# Patient Record
Sex: Male | Born: 1940 | Race: Black or African American | Hispanic: No | Marital: Single | Smoking: Never smoker
Health system: Southern US, Community
[De-identification: ages and names within clinical notes are randomized; demographics above are authoritative.]

## PROBLEM LIST (undated history)

## (undated) DIAGNOSIS — N189 Chronic kidney disease, unspecified: Secondary | ICD-10-CM

## (undated) DIAGNOSIS — E119 Type 2 diabetes mellitus without complications: Secondary | ICD-10-CM

## (undated) DIAGNOSIS — I10 Essential (primary) hypertension: Secondary | ICD-10-CM

## (undated) DIAGNOSIS — I2510 Atherosclerotic heart disease of native coronary artery without angina pectoris: Secondary | ICD-10-CM

## (undated) DIAGNOSIS — E7800 Pure hypercholesterolemia, unspecified: Secondary | ICD-10-CM

## (undated) DIAGNOSIS — I509 Heart failure, unspecified: Secondary | ICD-10-CM

## (undated) HISTORY — DX: Chronic kidney disease, unspecified: N18.9

## (undated) HISTORY — PX: CORONARY ARTERY BYPASS GRAFT: SHX141

---

## 2009-09-20 ENCOUNTER — Emergency Department (HOSPITAL_COMMUNITY): Admission: EM | Admit: 2009-09-20 | Discharge: 2009-09-20 | Payer: Self-pay | Admitting: Emergency Medicine

## 2011-04-04 ENCOUNTER — Other Ambulatory Visit: Payer: Self-pay | Admitting: Otolaryngology

## 2011-04-04 DIAGNOSIS — D449 Neoplasm of uncertain behavior of unspecified endocrine gland: Secondary | ICD-10-CM

## 2011-04-06 ENCOUNTER — Ambulatory Visit
Admission: RE | Admit: 2011-04-06 | Discharge: 2011-04-06 | Disposition: A | Discharge status: Home / Self Care | Payer: Medicare Other | Source: Ambulatory Visit | Attending: Otolaryngology | Admitting: Otolaryngology

## 2011-04-06 DIAGNOSIS — D449 Neoplasm of uncertain behavior of unspecified endocrine gland: Secondary | ICD-10-CM

## 2011-04-10 ENCOUNTER — Other Ambulatory Visit: Payer: Self-pay | Admitting: Otolaryngology

## 2011-04-10 DIAGNOSIS — E041 Nontoxic single thyroid nodule: Secondary | ICD-10-CM

## 2011-04-12 ENCOUNTER — Other Ambulatory Visit (HOSPITAL_COMMUNITY)
Admission: RE | Admit: 2011-04-12 | Discharge: 2011-04-12 | Disposition: A | Discharge status: Home / Self Care | Payer: Medicare Other | Source: Ambulatory Visit | Attending: Interventional Radiology | Admitting: Interventional Radiology

## 2011-04-12 ENCOUNTER — Ambulatory Visit
Admission: RE | Admit: 2011-04-12 | Discharge: 2011-04-12 | Disposition: A | Discharge status: Home / Self Care | Payer: Medicare Other | Source: Ambulatory Visit | Attending: Otolaryngology | Admitting: Otolaryngology

## 2011-04-12 DIAGNOSIS — D34 Benign neoplasm of thyroid gland: Secondary | ICD-10-CM | POA: Insufficient documentation

## 2011-04-12 DIAGNOSIS — E041 Nontoxic single thyroid nodule: Secondary | ICD-10-CM

## 2011-10-12 ENCOUNTER — Other Ambulatory Visit: Payer: Self-pay | Admitting: Otolaryngology

## 2011-10-12 DIAGNOSIS — D449 Neoplasm of uncertain behavior of unspecified endocrine gland: Secondary | ICD-10-CM

## 2011-10-25 ENCOUNTER — Ambulatory Visit
Admission: RE | Admit: 2011-10-25 | Discharge: 2011-10-25 | Disposition: A | Discharge status: Home / Self Care | Payer: Medicare Other | Source: Ambulatory Visit | Attending: Otolaryngology | Admitting: Otolaryngology

## 2011-10-25 DIAGNOSIS — D449 Neoplasm of uncertain behavior of unspecified endocrine gland: Secondary | ICD-10-CM

## 2013-07-11 IMAGING — US US SOFT TISSUE HEAD/NECK
1 series · 14 of 25 positions shown · non-contrast
Comparison: None.

CLINICAL DATA: Nodule noted on outside study, not currently
available

THYROID ULTRASOUND
TECHNIQUE: Ultrasound examination of the thyroid gland and adjacent
soft tissues was performed.

[Series 1: us soft tissue head/neck · 0.07mm/px · 14 of 58 slices shown]
[im 1/58]
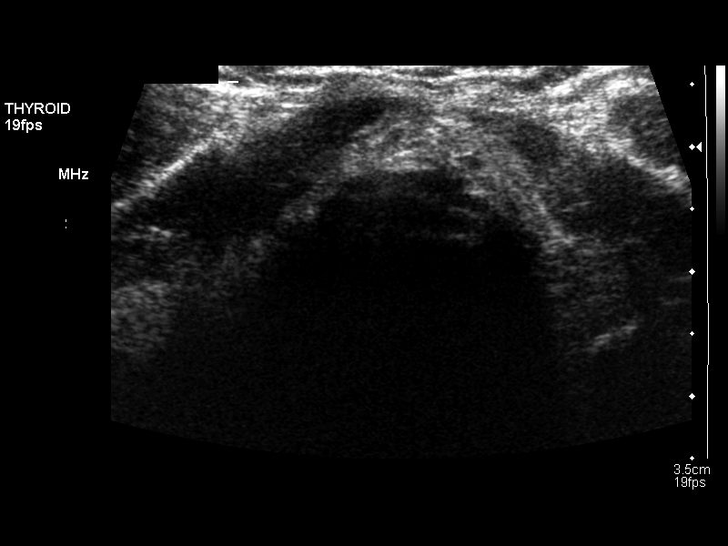
[im 5/58]
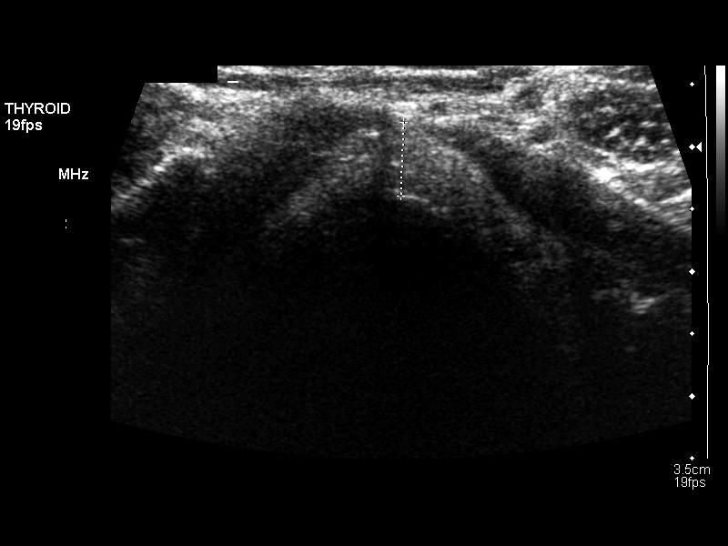
[im 10/58]
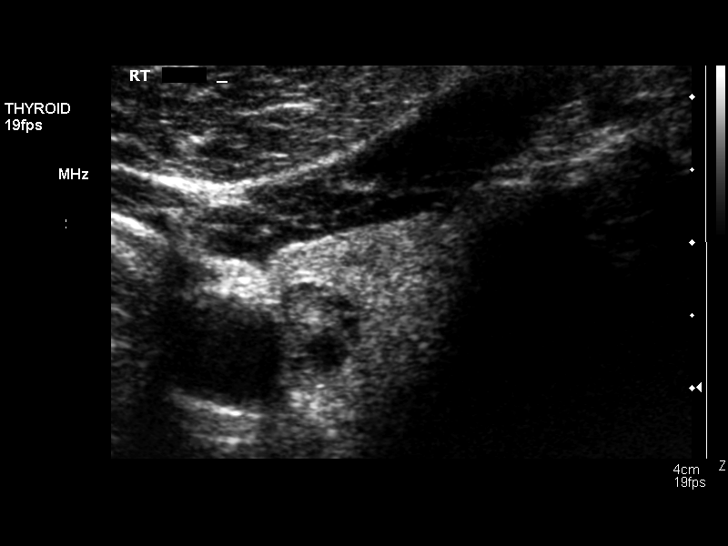
[im 15/58]
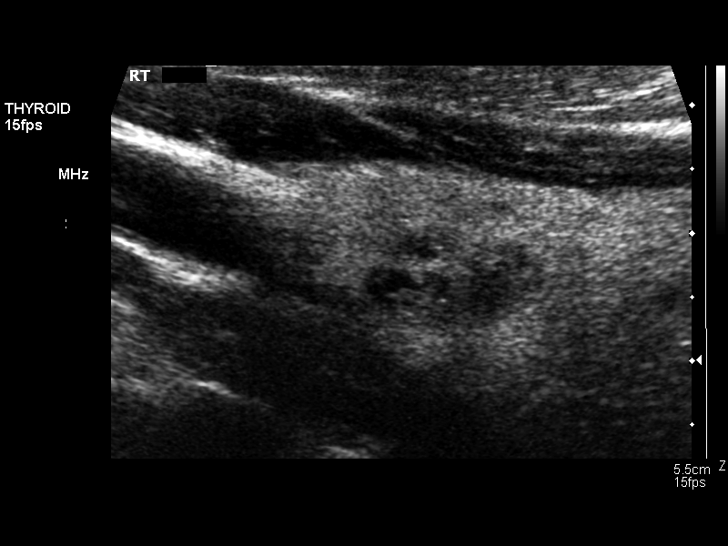
[im 20/58]
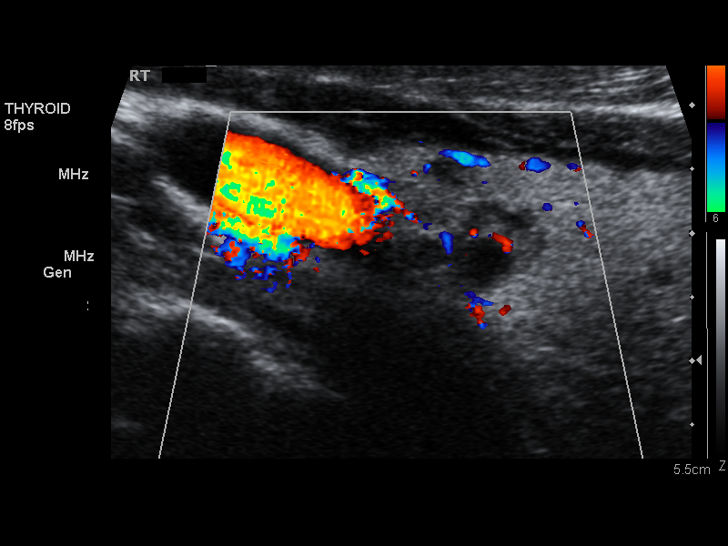
[im 22/58]
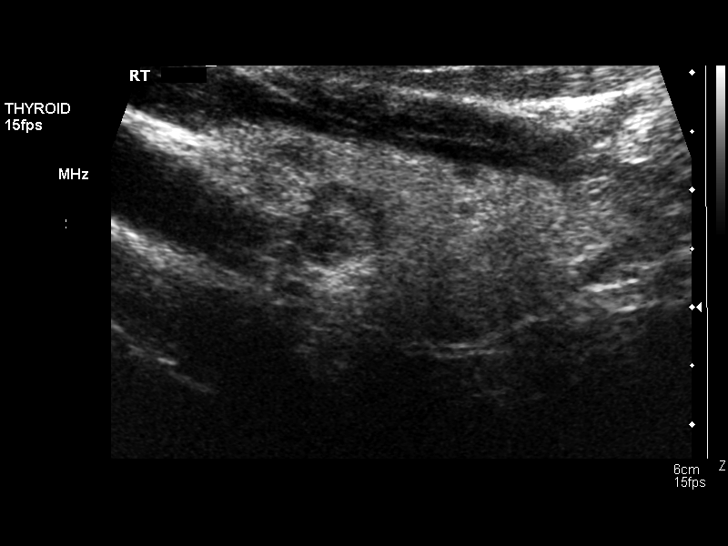
[im 27/58]
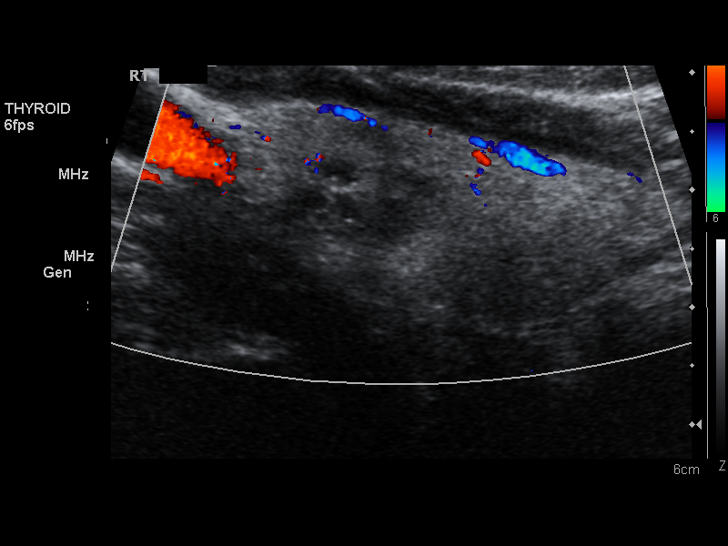
[im 31/58]
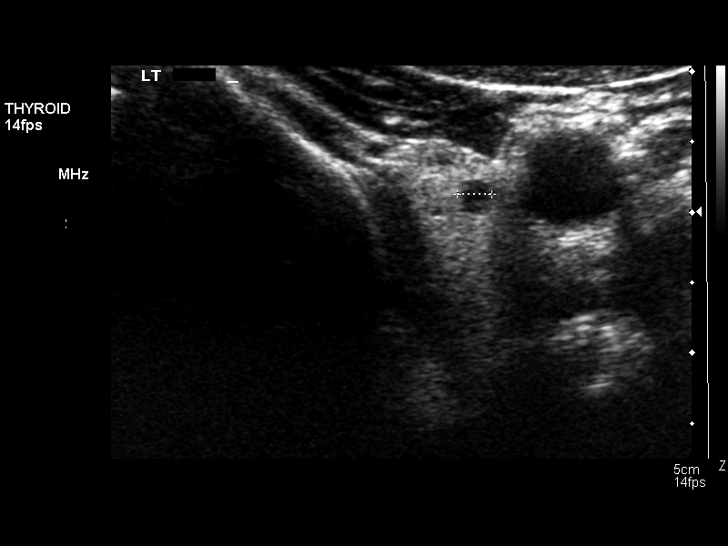
[im 36/58]
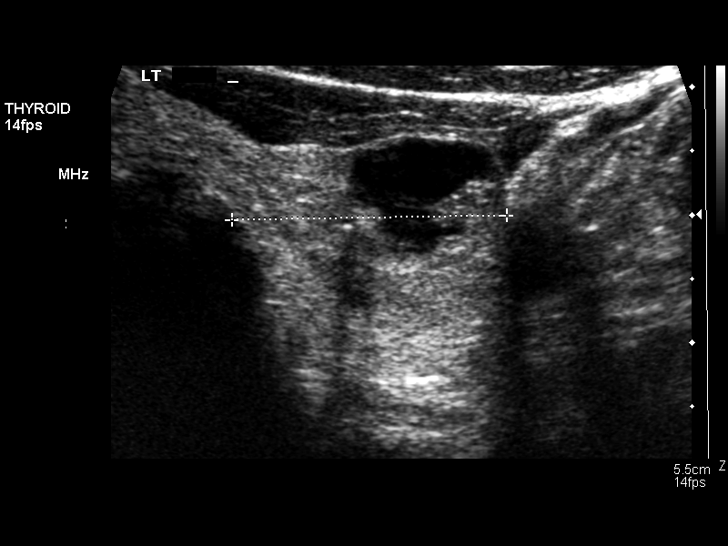
[im 39/58]
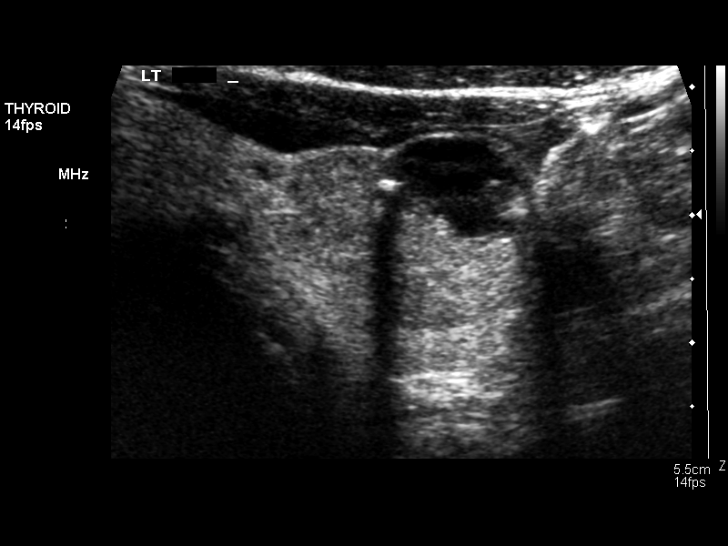
[im 43/58]
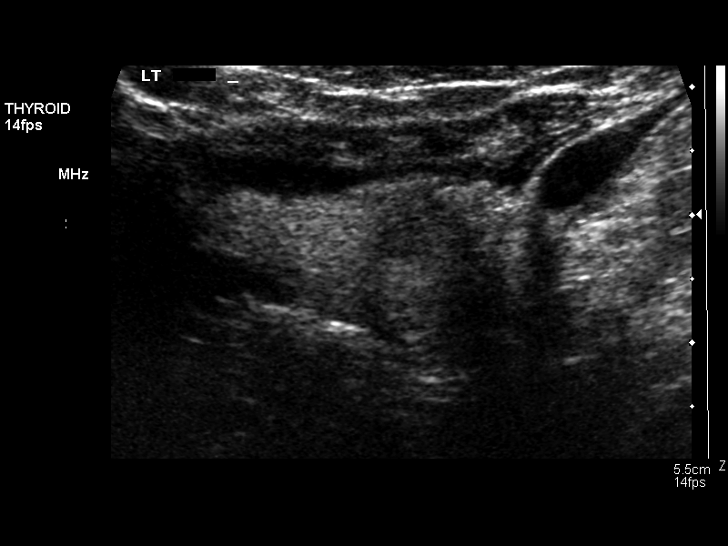
[im 48/58]
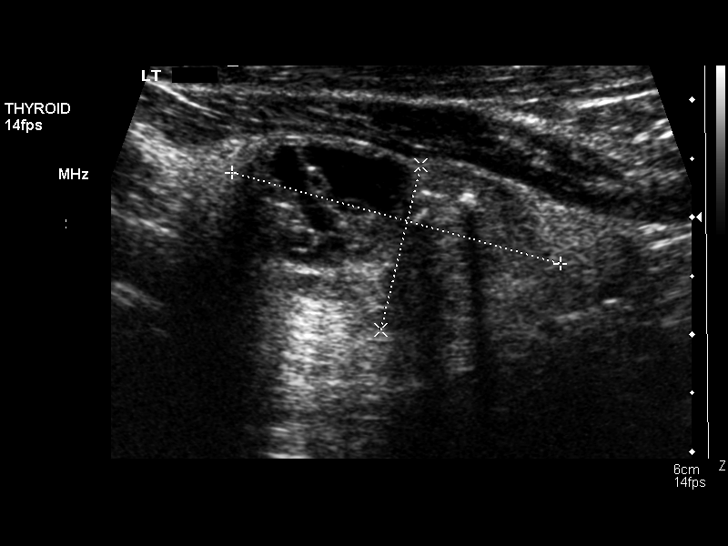
[im 53/58]
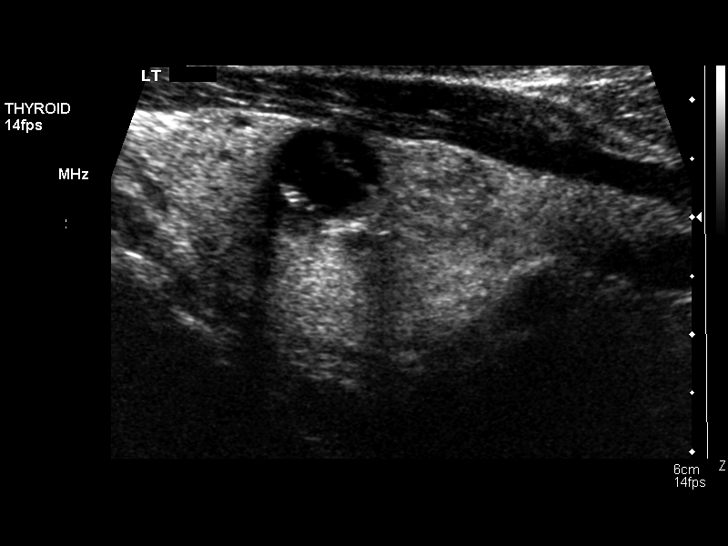
[im 58/58]
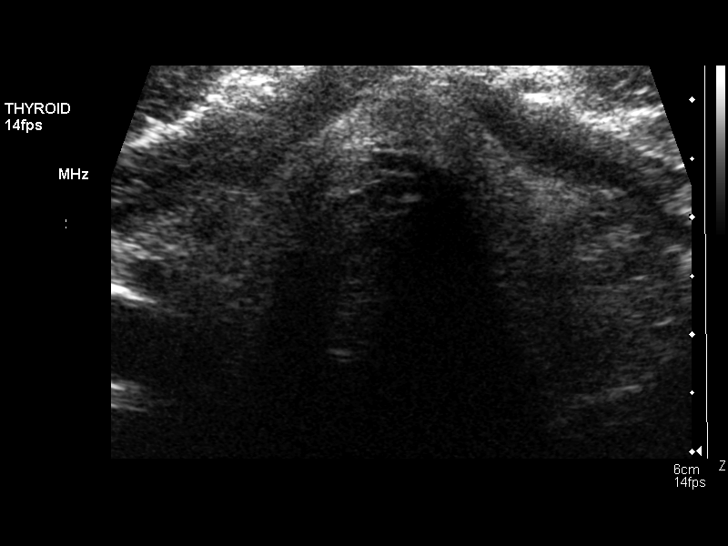

[14 of 25 positions shown; findings below may reference images not displayed]

FINDINGS: Right thyroid lobe:  4.8 x 1.8 x 1.5 cm.
Left thyroid lobe:  4.8 x 1.9 x 2.2 cm.
Isthmus:  5.9 mm in thickness.

Focal nodules:  The echogenicity of the thyroid gland is slightly
inhomogeneous.  There are thyroid nodules bilaterally.  The
dominant nodule is both cystic and solid and does contain coarse
calcifications occupying the left mid lobe of thyroid measuring
x 1.5 x 2.0 cm.  Smaller nodules which are primarily hypoechoic are
noted bilaterally of 8 mm or less in size.

Lymphadenopathy:  None visualized.
IMPRESSION: The thyroid gland is within upper limits of normal with nodules
bilaterally.  The dominant nodule on the left is both cystic and
solid measuring 2.9 x 1.5 x 2.0 cm.

## 2013-07-17 IMAGING — US US THYROID BIOPSY
1 series · 14 of 17 positions shown · non-contrast
Comparison: 04/06/2011

CLINICAL DATA: Dominant left inferior thyroid nodule

ULTRASOUND GUIDED NEEDLE ASPIRATE BIOPSY OF THE THYROID GLAND

[Series 1: us thyroid biopsy · 0.08mm/px · 17 acquisitions, 14 frames shown]
[im 1/17]
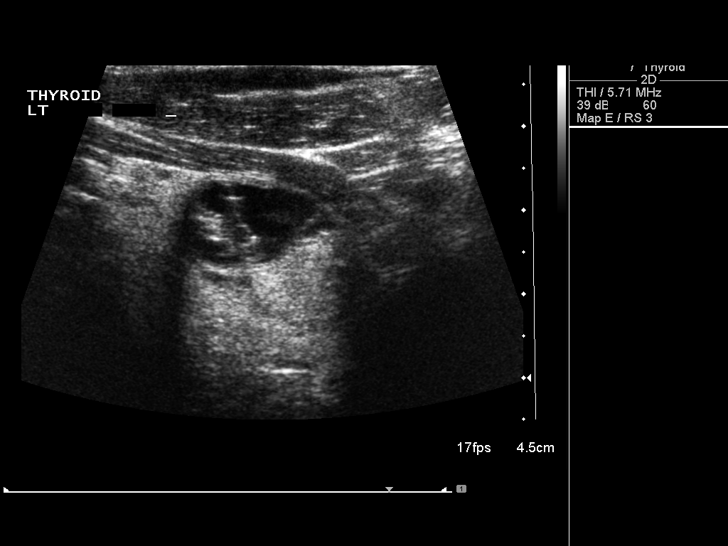
[im 2/17]
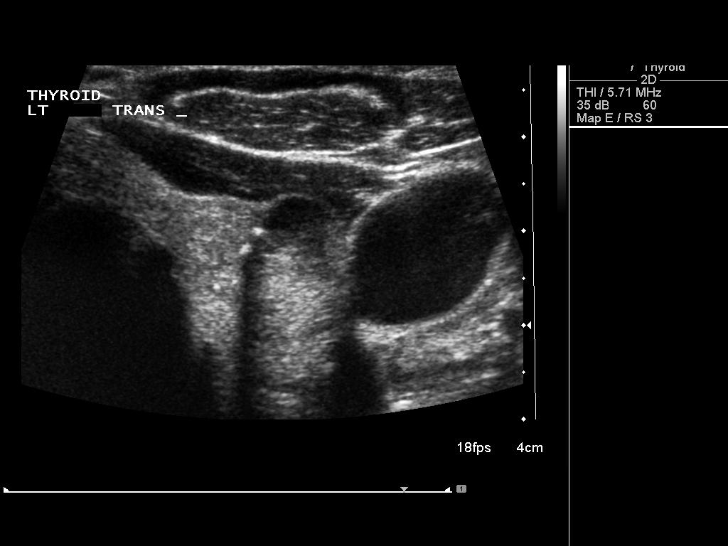
[im 4/17]
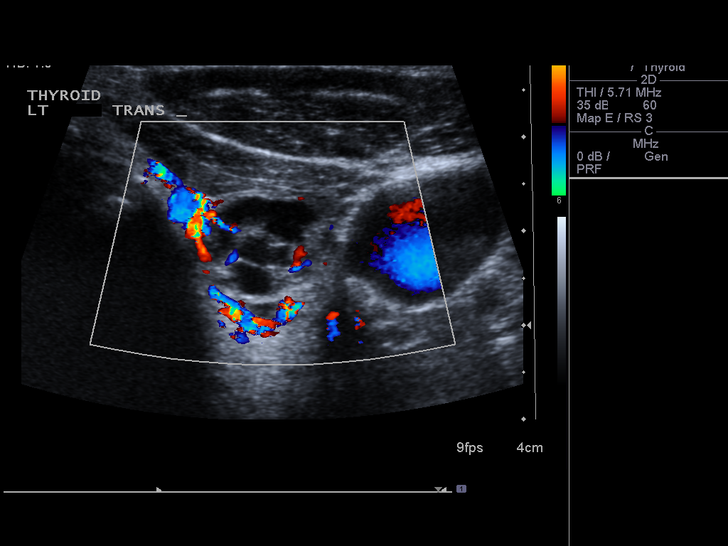
[im 5/17]
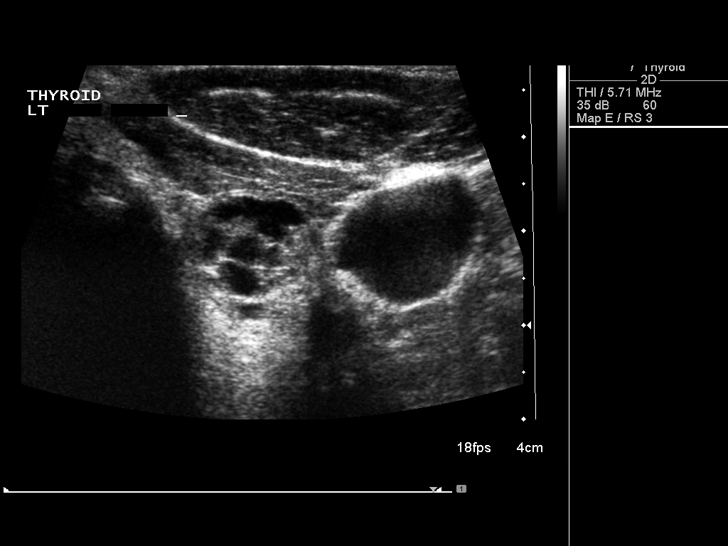
[im 6/17]
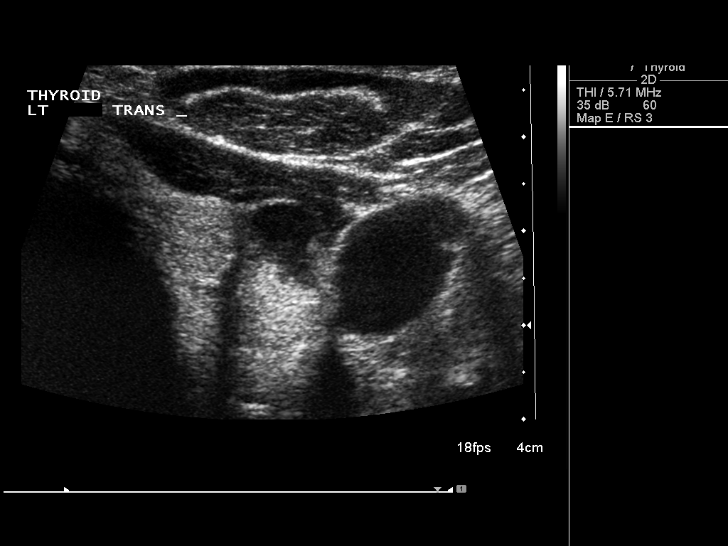
[im 7/17]
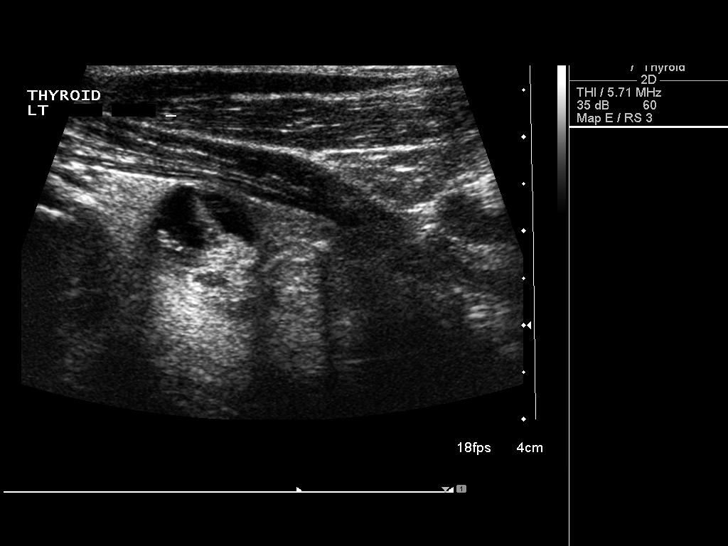
[im 8/17]
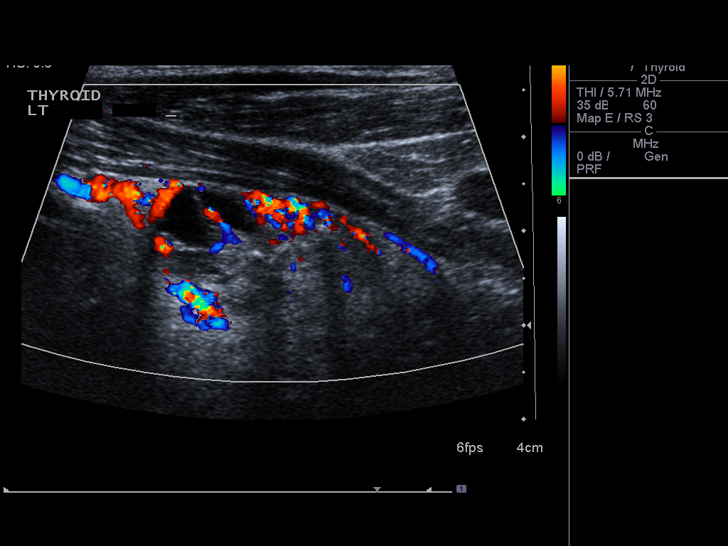
[im 10/17]
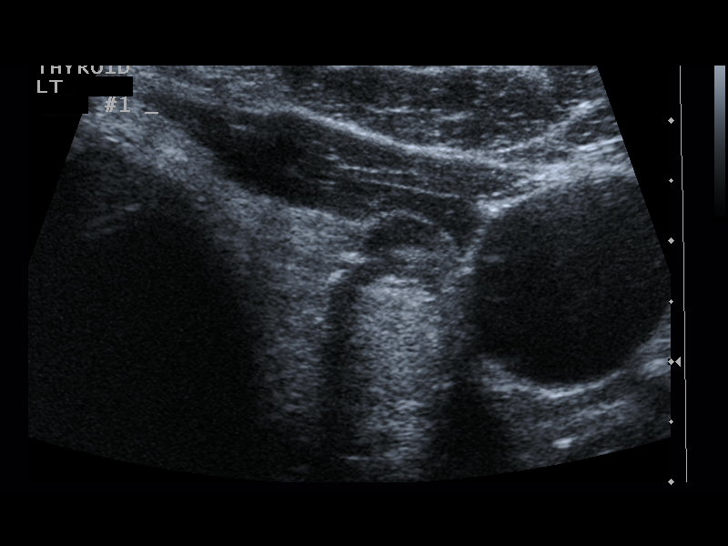
[im 11/17]
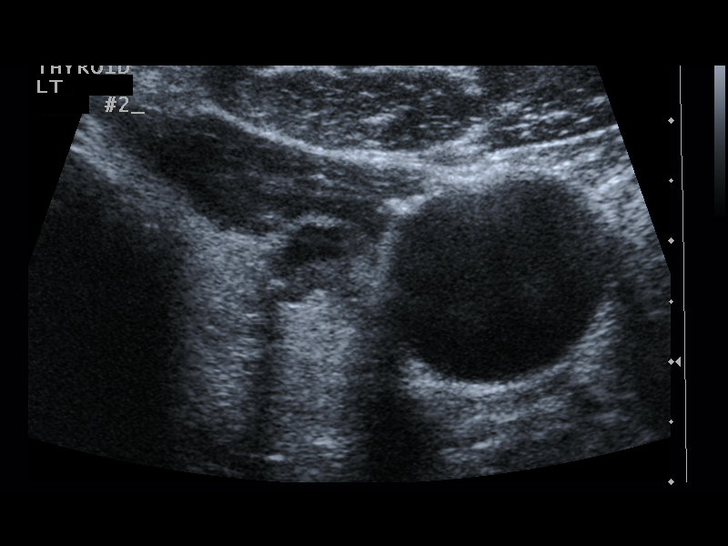
[im 12/17]
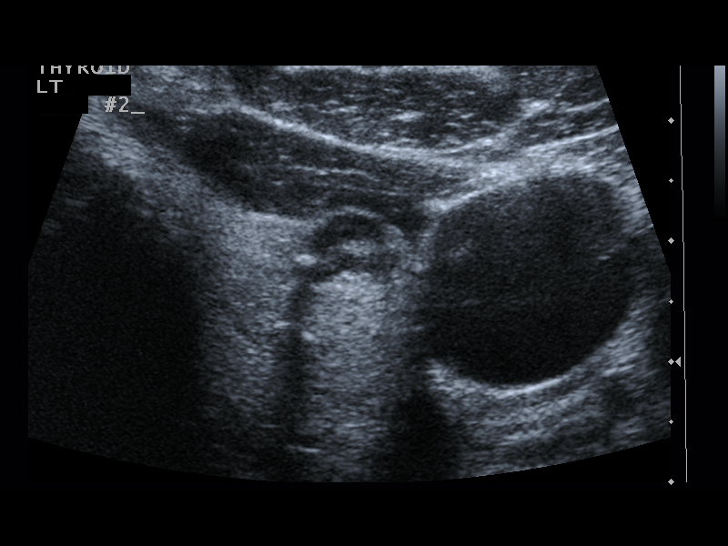
[im 13/17]
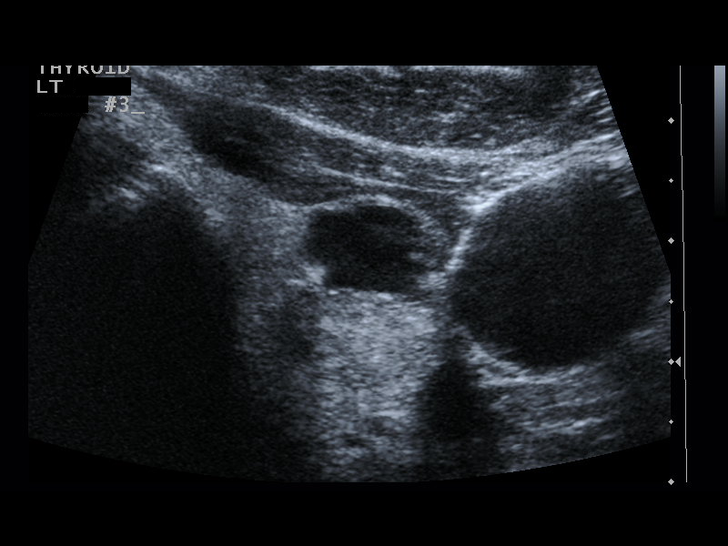
[im 14/17]
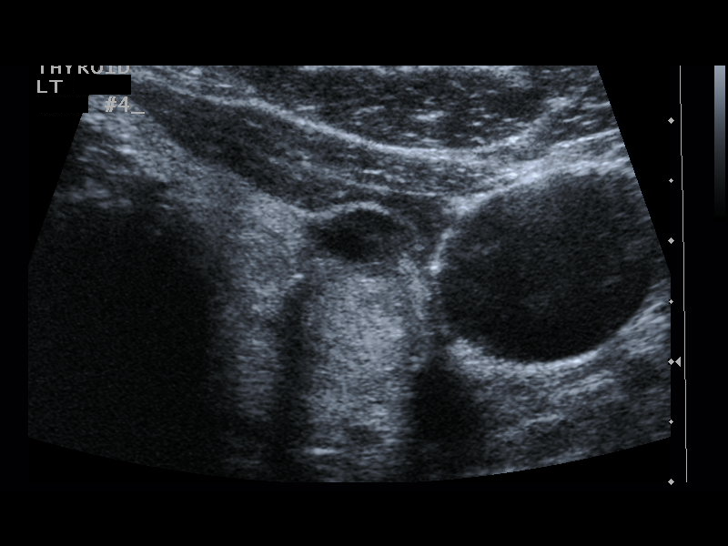
[im 16/17]
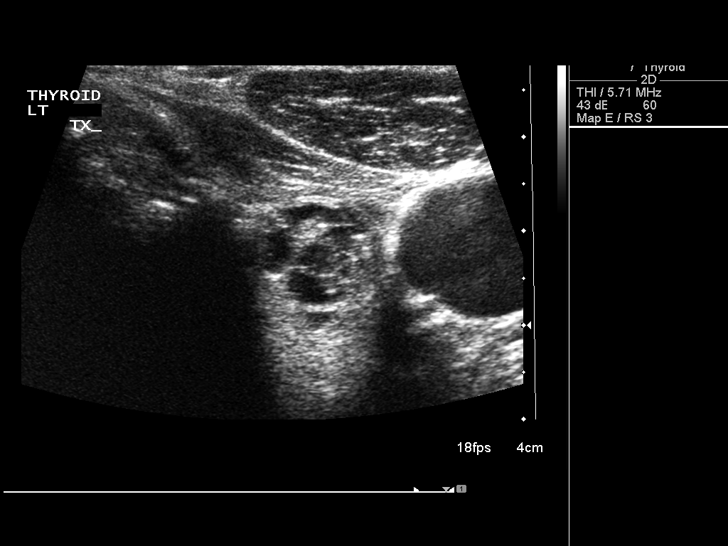
[im 17/17]
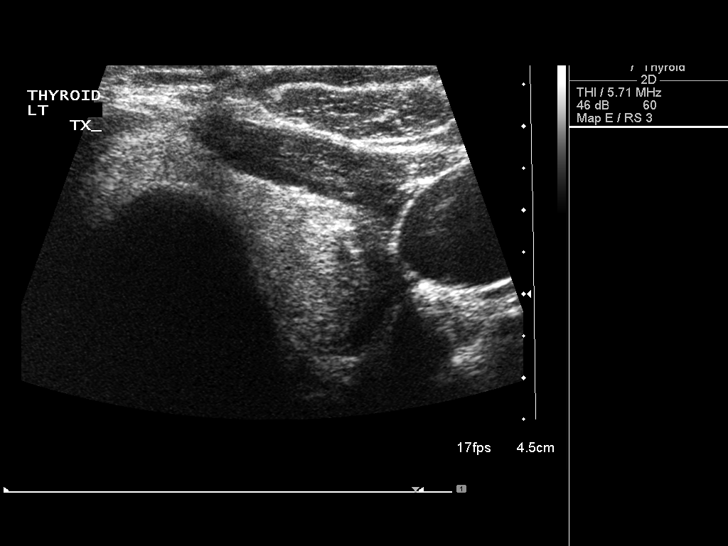

[14 of 17 positions shown; findings below may reference images not displayed]

Thyroid biopsy was thoroughly discussed with the patient and
questions were answered.  The benefits, risks, alternatives, and
complications were also discussed.  The patient understands and
wishes to proceed with the procedure.  Written consent was
obtained.

Ultrasound was performed to localize and mark an adequate site for
the biopsy.  The patient was then prepped and draped in a normal
sterile fashion.  Local anesthesia was provided with 1% lidocaine.
Using direct ultrasound guidance, 4 passes were made using 25 gauge
needles into the nodule within the left lobe of the thyroid.
Ultrasound was used to confirm needle placements on all occasions.
Specimens were sent to Pathology for analysis.

Complications:  No immediate
FINDINGS: Imaging confirms needle placed in the dominant left
inferior thyroid nodule
IMPRESSION: Ultrasound guided needle aspirate biopsy performed of the dominant
left inferior thyroid nodule.

## 2022-06-19 ENCOUNTER — Encounter (HOSPITAL_COMMUNITY): Payer: Self-pay

## 2022-06-19 ENCOUNTER — Emergency Department (HOSPITAL_COMMUNITY)
Admission: EM | Admit: 2022-06-19 | Discharge: 2022-06-19 | Disposition: A | Discharge status: Home / Self Care | Payer: 59 | Attending: Emergency Medicine | Admitting: Emergency Medicine

## 2022-06-19 ENCOUNTER — Emergency Department (HOSPITAL_COMMUNITY): Payer: 59

## 2022-06-19 ENCOUNTER — Other Ambulatory Visit: Payer: Self-pay

## 2022-06-19 DIAGNOSIS — I2510 Atherosclerotic heart disease of native coronary artery without angina pectoris: Secondary | ICD-10-CM | POA: Diagnosis not present

## 2022-06-19 DIAGNOSIS — I110 Hypertensive heart disease with heart failure: Secondary | ICD-10-CM | POA: Diagnosis not present

## 2022-06-19 DIAGNOSIS — N179 Acute kidney failure, unspecified: Secondary | ICD-10-CM | POA: Insufficient documentation

## 2022-06-19 DIAGNOSIS — E119 Type 2 diabetes mellitus without complications: Secondary | ICD-10-CM | POA: Diagnosis not present

## 2022-06-19 DIAGNOSIS — I509 Heart failure, unspecified: Secondary | ICD-10-CM | POA: Insufficient documentation

## 2022-06-19 DIAGNOSIS — Z951 Presence of aortocoronary bypass graft: Secondary | ICD-10-CM | POA: Diagnosis not present

## 2022-06-19 DIAGNOSIS — R0602 Shortness of breath: Secondary | ICD-10-CM | POA: Insufficient documentation

## 2022-06-19 HISTORY — DX: Type 2 diabetes mellitus without complications: E11.9

## 2022-06-19 HISTORY — DX: Pure hypercholesterolemia, unspecified: E78.00

## 2022-06-19 HISTORY — DX: Heart failure, unspecified: I50.9

## 2022-06-19 HISTORY — DX: Atherosclerotic heart disease of native coronary artery without angina pectoris: I25.10

## 2022-06-19 HISTORY — DX: Essential (primary) hypertension: I10

## 2022-06-19 LAB — CBC
HCT: 39.2 % (ref 39.0–52.0)
Hemoglobin: 12.9 g/dL — ABNORMAL LOW (ref 13.0–17.0)
MCH: 31.3 pg (ref 26.0–34.0)
MCHC: 32.9 g/dL (ref 30.0–36.0)
MCV: 95.1 fL (ref 80.0–100.0)
Platelets: 265 10*3/uL (ref 150–400)
RBC: 4.12 MIL/uL — ABNORMAL LOW (ref 4.22–5.81)
RDW: 13.4 % (ref 11.5–15.5)
WBC: 6.3 10*3/uL (ref 4.0–10.5)
nRBC: 0 % (ref 0.0–0.2)

## 2022-06-19 LAB — BASIC METABOLIC PANEL
Anion gap: 11 (ref 5–15)
BUN: 25 mg/dL — ABNORMAL HIGH (ref 8–23)
CO2: 21 mmol/L — ABNORMAL LOW (ref 22–32)
Calcium: 9.4 mg/dL (ref 8.9–10.3)
Chloride: 98 mmol/L (ref 98–111)
Creatinine, Ser: 1.97 mg/dL — ABNORMAL HIGH (ref 0.61–1.24)
GFR, Estimated: 33 mL/min — ABNORMAL LOW (ref >60)
Glucose, Bld: 215 mg/dL — ABNORMAL HIGH (ref 70–99)
Potassium: 4.1 mmol/L (ref 3.5–5.1)
Sodium: 130 mmol/L — ABNORMAL LOW (ref 135–145)

## 2022-06-19 LAB — TROPONIN I (HIGH SENSITIVITY)
Troponin I (High Sensitivity): 7 ng/L (ref <18)
Troponin I (High Sensitivity): 7 ng/L (ref <18)

## 2022-06-19 LAB — BRAIN NATRIURETIC PEPTIDE: B Natriuretic Peptide: 19.1 pg/mL (ref 0.0–100.0)

## 2022-06-19 NOTE — ED Notes (Signed)
Pt ambulated with pulse ox independently with steady gait. O2 sat 95% during ambulation, pt denies ShOB.

## 2022-06-19 NOTE — ED Provider Notes (Signed)
Marcus Huynh Note   CSN: [id number] Arrival date & time: 06/19/22  1156     History  Chief Complaint  Patient presents with   Chest Pain   Shortness of Breath    Marcus Huynh is a 82 y.o. 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  Patient states he feels like he cannot do anything even walking short distances makes him feel feel very tired.  He is not noticing any focal weakness or numbness. Patient does have baseline chest discomfort that has been occurring for a long time he says since he found out that his sternal wires from his CABG are little bit loose, he states this is not changed from baseline Denies fever, cough, nausea, vomiting, diarrhea, blood in the stool, hematuria, dysuria, abdominal pain.   Chest Pain Associated symptoms: shortness of breath   Shortness of Breath Associated symptoms: chest pain        Home Medications Prior to Admission medications   Not on File      Allergies    Atorvastatin    Review of Systems   Review of Systems  Respiratory:  Positive for shortness of breath.   Cardiovascular:  Positive for chest pain.    Physical Exam Updated Vital Signs BP (!) 168/84 (BP Location: Right Arm)   Pulse 89   Temp 97.9 F (36.6 C) (Oral)   Resp 16   Ht 5\' 9"  (1.753 m)   Wt 95.3 kg   SpO2 100%   BMI 31.01 kg/m  Physical Exam Vitals and nursing note reviewed.  Constitutional:      General: He is not in acute distress.    Appearance: He is well-developed.  HENT:     Head: Normocephalic and atraumatic.  Eyes:     Conjunctiva/sclera: Conjunctivae normal.  Cardiovascular:     Rate and Rhythm: Normal rate and regular rhythm.     Heart sounds: No murmur heard. Pulmonary:     Effort: Pulmonary effort is normal. No  respiratory distress.     Breath sounds: Normal breath sounds.  Abdominal:     Palpations: Abdomen is soft.     Tenderness: There is no abdominal tenderness.  Musculoskeletal:        General: No swelling.     Cervical back: Neck supple.  Skin:    General: Skin is warm and dry.     Capillary Refill: Capillary refill takes less than 2 seconds.  Neurological:     Mental Status: He is alert.  Psychiatric:        Mood and Affect: Mood normal.     ED Results / Procedures / Treatments   Labs (all labs ordered are listed, but only abnormal results are displayed) Labs Reviewed  BASIC METABOLIC PANEL - Abnormal; Notable for the following components:      Result Value   Sodium 130 (*)    CO2 21 (*)    Glucose, Bld 215 (*)    BUN 25 (*)    Creatinine, Ser 1.97 (*)    GFR, Estimated 33 (*)    All other components within normal limits  CBC - Abnormal; Notable for the following components:   RBC 4.12 (*)    Hemoglobin 12.9 (*)    All other components  within normal limits  BRAIN NATRIURETIC PEPTIDE  TROPONIN I (HIGH SENSITIVITY)  TROPONIN I (HIGH SENSITIVITY)    EKG EKG Interpretation  Date/Time:  Monday June 19 2022 12:00:21 EDT Ventricular Rate:  103 PR Interval:  162 QRS Duration: 74 QT Interval:  350 QTC Calculation: 458 R Axis:   12 Text Interpretation: Sinus tachycardia with Premature atrial complexes Otherwise normal ECG No previous ECGs available no prior ECG for comparison. possible S1Q3T3 pattern. no STEMI Confirmed by Antony Blackbird [id number]) on 06/19/2022 4:08:57 PM  Radiology DG Chest 2 View  Result Date: 06/19/2022 CLINICAL DATA:  Chest pain and shortness of breath for 1 week. EXAM: CHEST - 2 VIEW COMPARISON:  None Available. FINDINGS: Median sternotomy wires are noted. The cardiomediastinal silhouette is normal. The lungs are clear, with no focal consolidation or pulmonary edema. There is no pleural effusion or pneumothorax There is no acute osseous abnormality.  There is multilevel degenerative change of the thoracic spine. IMPRESSION: No radiographic evidence of acute cardiopulmonary process. Electronically Signed   By: Valetta Mole M.D.   On: 06/19/2022 13:26    ED Course/ Medical Decision Making/ A&P                             Medical Decision Making  Patient presented hemodynamically stable.  On exam, he had no focal abnormal lung sounds, no reproducible chest pain, no significant edema or abdominal tenderness.  Labs were significant for AKI with creatinine 1.97.  Upon further review, his primary care has been monitoring his kidney function.  Due to his heart failure history, and the concern with his symptoms, patient was not given fluids at this visit.  He otherwise had no notable leukocytosis or anemia, no electrolyte abnormalities.  Initial and delta troponin were both WNL as was BNP. Chest x-ray negative for focal consolidation, pleural effusion or pulmonary edema.  EKG was notable for sinus tachycardia with premature atrial complexes, no signs of acute ischemia.  Discussed with patient admission versus going home and following up in 48 hours for repeat labs.  He expressed understanding of the risk and benefits of both, he felt safe going home and following up with his primary care physician regarding his kidney function.  Patient deemed safe for discharge as ACS or other cardiac event less likely at this time.  He was given return precautions for which she expressed understanding.          Final Clinical Impression(s) / ED Diagnoses Final diagnoses:  Shortness of breath     Bradd Canary, MD 06/19/22 2247    Tegeler, Gwenyth Allegra, MD 06/21/22 [phone]

## 2022-06-19 NOTE — ED Triage Notes (Signed)
CP and SOB x 1 week.  Hx of CABG in 90's.  Reports increased fatigue.

## 2022-06-19 NOTE — Discharge Instructions (Addendum)
Your labs did show slight increase in your creatinine which can suggest worsening kidney function.  As we discussed, you should follow-up with your primary care physician to have repeat labs drawn in approximately 48 hours to ensure that this number is not worsening.  In the meantime you should continue to hydrate.  Your other labs looked okay and there were no findings suggesting that you are having a heart attack or worsening heart function, or any lung issues at this time.  Should you develop worsening chest pain, worsening shortness of breath, dizziness, passing out, persistent nausea or vomiting, fever, blood in your urine or abdominal pain please return to the emergency department

## 2022-06-19 NOTE — ED Provider Triage Note (Signed)
Emergency Medicine Provider Triage Evaluation Note  Marcus Huynh , a 82 y.o. male  was evaluated in triage.  Pt complains of chest pain, fatigue for 2 days.  Patient reports the chest pain feels just like pressure in the chest.  He denies exertional component.  He feels short of breath/fatigue associated with it.  Patient Dors is a history of CABG in the 90s, no cardiac procedures since then.  He is followed by cardiology at [facility], stress test just last year, they did report that he had some partial blockage but he cannot tell me of which vessel or how severe.  He denies fever, chills, cough, and denies any swelling in legs, he denies nausea, vomiting, radiation.  History of high blood pressure, high cholesterol, diabetes, no history of tobacco abuse..  Review of Systems  Positive: Chest pain, shob, fatigue Negative: Fever, chills, cough  Physical Exam  BP (!) 154/95 (BP Location: Right Arm)   Pulse (!) 101   Temp 98.2 F (36.8 C) (Oral)   Resp 20   Ht 5\' 9"  (1.753 m)   Wt 95.3 kg   SpO2 96%   BMI 31.01 kg/m  Gen:   Awake, no distress   Resp:  Normal effort  MSK:   Moves extremities without difficulty  Other:    Medical Decision Making  Medically screening exam initiated at 12:32 PM.  Appropriate orders placed.  Juliann Mule was informed that the remainder of the evaluation will be completed by another provider, this initial triage assessment does not replace that evaluation, and the importance of remaining in the ED until their evaluation is complete.  Workup initiated in triage    Anselmo Pickler, [place] 06/19/22 1232

## 2024-01-28 ENCOUNTER — Other Ambulatory Visit: Payer: Self-pay

## 2024-01-28 DIAGNOSIS — C61 Malignant neoplasm of prostate: Secondary | ICD-10-CM

## 2024-02-11 ENCOUNTER — Ambulatory Visit: Attending: Internal Medicine | Admitting: Internal Medicine

## 2024-02-11 ENCOUNTER — Encounter: Payer: Self-pay | Admitting: Internal Medicine

## 2024-02-11 VITALS — BP 136/58 | HR 86 | Ht 69.5 in | Wt 208.8 lb

## 2024-02-11 DIAGNOSIS — I10 Essential (primary) hypertension: Secondary | ICD-10-CM | POA: Insufficient documentation

## 2024-02-11 DIAGNOSIS — Q249 Congenital malformation of heart, unspecified: Secondary | ICD-10-CM | POA: Insufficient documentation

## 2024-02-11 DIAGNOSIS — E785 Hyperlipidemia, unspecified: Secondary | ICD-10-CM | POA: Insufficient documentation

## 2024-02-11 DIAGNOSIS — I5032 Chronic diastolic (congestive) heart failure: Secondary | ICD-10-CM | POA: Insufficient documentation

## 2024-02-11 DIAGNOSIS — R0602 Shortness of breath: Secondary | ICD-10-CM

## 2024-02-11 DIAGNOSIS — I25118 Atherosclerotic heart disease of native coronary artery with other forms of angina pectoris: Secondary | ICD-10-CM | POA: Diagnosis not present

## 2024-02-11 LAB — LIPID PANEL
Chol/HDL Ratio: 2.6 ratio (ref 0.0–5.0)
Cholesterol, Total: 133 mg/dL (ref 100–199)
HDL: 51 mg/dL (ref >39)
LDL Chol Calc (NIH): 67 mg/dL (ref 0–99)
Triglycerides: 78 mg/dL (ref 0–149)
VLDL Cholesterol Cal: 15 mg/dL (ref 5–40)

## 2024-02-11 NOTE — Progress Notes (Signed)
 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  Says that about a year ago he was having worsening exertional shortness of breath on minimal exertion and had a PET/CT which was abnormal.  He went for left heart catheterization that showed moderate nonobstructive disease with slightly elevated LVEDP at 17 and sluggish flow concerning for microvascular disease.  He was put on spironolactone 12.5 mg which did not help his symptoms and had hyponatremia so this was not resumed.  His symptoms had improved after starting Imdur 30 mg.  Since that time he has been able to walk 2 miles daily before having to rest without any chest discomfort.  Has mild left lower extremity edema on occasion but otherwise no other complaints.  Tobacco use: No Alcohol use: No Activity level: Walks 2 miles daily Pertinent family history: 3 siblings passed away at young age of MI including a sister 59 and 2 other siblings in their 85s.    ROS:  Review of Systems  All other systems reviewed and are negative.   Physical Exam  Physical Exam Vitals and nursing note reviewed.  Constitutional:      Appearance: Normal appearance.  HENT:     Head: Normocephalic and atraumatic.  Eyes:     Conjunctiva/sclera: Conjunctivae normal.  Neck:     Vascular: No carotid bruit.   Cardiovascular:     Rate and Rhythm: Normal rate and regular rhythm.  Pulmonary:     Effort: Pulmonary effort is normal.     Breath sounds: Normal breath sounds.  Musculoskeletal:        General: No swelling or tenderness.  Skin:    Coloration: Skin is not jaundiced or pale.  Neurological:     Mental Status: He is alert.     VS:  BP (!) 136/58   Pulse 86   Ht 5' 9.5 (1.765 m)   Wt 208 lb 12.8 oz (94.7 kg)   SpO2 98%   BMI 30.39 kg/m         Wt Readings from Last 3 Encounters:  02/11/24 208 lb 12.8 oz (94.7 kg)  06/19/22 210 lb (95.3 kg)     EKG Interpretation Date/Time:  Monday February 11 2024 08:19:41 EST Ventricular Rate:  86 PR Interval:  194 QRS Duration:  78 QT Interval:  340 QTC Calculation: 406 R Axis:   15  Text Interpretation: Normal sinus rhythm T wave abnormality, consider inferolateral ischemia When compared with ECG of 19-Jun-2022 12:00, Premature atrial complexes are no longer Present T wave inversion now evident in Inferior leads T wave inversion now evident in Lateral leads QT has shortened Confirmed by Calender Emeline [id number]) on 02/11/2024 8:27:47 AM    Studies Reviewed  Prior Cardiac Studies from [facility];  TTE 07/24/22:  The left ventricular size is normal with mild concentric hypertrophy.  Left ventricular systolic function is normal; LVEF = 60-65%.  The right ventricle is mildly dilated. The right ventricular systolic  function is mild to moderately  reduced.  There is no significant valvular stenosis or regurgitation.  There is no significant change in comparison with the last study.    PET CT 07/28/22 from [facility]:  1. Large sized, severe intensity, completely reversible perfusion defect involving the apex, apical anterior, mid anterior, basal anterior, and mid anterolateral wall consistent with ischemia in the territory of the typical proximal to distal LAD territory. 2. Normal LV systolic function 3. Global myocardial flow reserve is  preserved but is severely decreased regionally in the aforementioned perfusion defect (1.5) 4. Severe coronary artery calcium in the proximal LAD 5 Ancillary CT findings as above.  Left heart catheterization 10/03/2022 at [facility] Coronary Findings Diagnostic Dominance: Right  Left Main: The vessel was visualized by angiography. The vessel is angiographically normal.  Left Anterior Descending: The vessel was visualized by angiography. Prox LAD lesion is 20% stenosed. Dist LAD lesion is 50% stenosed.  Left Circumflex: The vessel was visualized by angiography. The vessel is angiographically normal.  Right Coronary Artery: The vessel was visualized by angiography. Vessel size is large, larger than 3.0 mm. Dist RCA lesion is 60% stenosed. Right Posterior Descending Artery: The vessel was visualized by angiography. Right Posterior Atrioventricular Artery: The vessel was visualized by angiography. RPAV lesion is 90% stenosed.  - LVEDP 17 - Moderate nonobstructive disease - Triaged to medical therapy  Risk Assessment/Calculations             ASCVD risk score: The ASCVD Risk score (Arnett DK, et al., 2019) failed to calculate for the following reasons:   The 2019 ASCVD risk score is only valid for ages 69 to 29   ASSESSMENT  Dyspnea on exertion improved and stable after starting Imdur 30 mg over the past year.  Tolerating ambulating 2 miles daily.  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  2024 cath results from [facility] as above. 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 Limit salt. Moderate walking, 3-5 times/week for 30-50 minutes each session. Aim for at least 150 minutes.week. Goal should be pace of 3 miles/hour, or walking 1.5 miles in 30 minutes Avoidance of tobacco products. Avoid excess alcohol.  Follow up: 1 year or sooner if symptoms worsen          Signed, Phyllistine Domingos E Azelyn Batie, MD

## 2024-02-11 NOTE — Patient Instructions (Signed)
 Medication Instructions:  No medication changes were made at this visit. Continue current regimen.   Lab Work: Lipid panel today  Follow-Up: At [facility], you and your health needs are our priority.  As part of our continuing mission to provide you with exceptional heart care, our providers are all part of one team.  This team includes your primary Cardiologist (physician) and Advanced Practice Providers or APPs (Physician Assistants and Nurse Practitioners) who all work together to provide you with the care you need, when you need it.  Your next appointment:   1 year(s)  Provider:   Emeline FORBES Calender, MD    We recommend signing up for the patient portal called MyChart.  Sign up information is provided on this After Visit Summary.  MyChart is used to connect with patients for Virtual Visits (Telemedicine).  Patients are able to view lab/test results, encounter notes, upcoming appointments, etc.  Non-urgent messages can be sent to your provider as well.   To learn more about what you can do with MyChart, go to [email or web address].

## 2024-02-12 ENCOUNTER — Ambulatory Visit: Payer: Self-pay | Admitting: Internal Medicine

## 2024-02-13 NOTE — Telephone Encounter (Signed)
 I called and left a message for the patient about his lipid panel results. Per Dr. Kriste, Please let the patient know his lipid panel is well-controlled. Continue current management.SABRA

## 2024-02-23 ENCOUNTER — Inpatient Hospital Stay: Admission: RE | Admit: 2024-02-23 | Discharge: 2024-02-23

## 2024-02-23 DIAGNOSIS — C61 Malignant neoplasm of prostate: Secondary | ICD-10-CM

## 2024-02-23 MED ORDER — GADOPICLENOL 0.5 MMOL/ML IV SOLN
9.0000 mL | Freq: Once | INTRAVENOUS | Status: AC | PRN
  Administered 2024-02-23: 9 mL via INTRAVENOUS
